# Patient Record
Sex: Female | Born: 1935 | Race: White | Hispanic: No | Smoking: Never smoker
Health system: Southern US, Community
[De-identification: ages and names within clinical notes are randomized; demographics above are authoritative.]

## PROBLEM LIST (undated history)

## (undated) DIAGNOSIS — I509 Heart failure, unspecified: Secondary | ICD-10-CM

## (undated) DIAGNOSIS — I10 Essential (primary) hypertension: Secondary | ICD-10-CM

## (undated) HISTORY — PX: PACEMAKER IMPLANT: EP1218

---

## 2020-08-12 ENCOUNTER — Other Ambulatory Visit: Payer: Self-pay

## 2020-08-12 ENCOUNTER — Emergency Department (HOSPITAL_COMMUNITY)
Admission: EM | Admit: 2020-08-12 | Discharge: 2020-08-13 | Disposition: A | Discharge status: Home / Self Care | Payer: Medicare Other | Attending: Emergency Medicine | Admitting: Emergency Medicine

## 2020-08-12 ENCOUNTER — Emergency Department (HOSPITAL_COMMUNITY): Payer: Medicare Other

## 2020-08-12 ENCOUNTER — Encounter (HOSPITAL_COMMUNITY): Payer: Self-pay | Admitting: Emergency Medicine

## 2020-08-12 DIAGNOSIS — R4189 Other symptoms and signs involving cognitive functions and awareness: Secondary | ICD-10-CM

## 2020-08-12 DIAGNOSIS — R464 Slowness and poor responsiveness: Secondary | ICD-10-CM | POA: Insufficient documentation

## 2020-08-12 DIAGNOSIS — Z950 Presence of cardiac pacemaker: Secondary | ICD-10-CM | POA: Insufficient documentation

## 2020-08-12 DIAGNOSIS — R110 Nausea: Secondary | ICD-10-CM | POA: Insufficient documentation

## 2020-08-12 DIAGNOSIS — R232 Flushing: Secondary | ICD-10-CM | POA: Insufficient documentation

## 2020-08-12 DIAGNOSIS — I509 Heart failure, unspecified: Secondary | ICD-10-CM | POA: Insufficient documentation

## 2020-08-12 DIAGNOSIS — R55 Syncope and collapse: Secondary | ICD-10-CM | POA: Insufficient documentation

## 2020-08-12 DIAGNOSIS — I110 Hypertensive heart disease with heart failure: Secondary | ICD-10-CM | POA: Insufficient documentation

## 2020-08-12 DIAGNOSIS — R0602 Shortness of breath: Secondary | ICD-10-CM | POA: Diagnosis present

## 2020-08-12 HISTORY — DX: Heart failure, unspecified: I50.9

## 2020-08-12 HISTORY — DX: Essential (primary) hypertension: I10

## 2020-08-12 LAB — BASIC METABOLIC PANEL
Anion gap: 12 (ref 5–15)
BUN: 16 mg/dL (ref 8–23)
CO2: 22 mmol/L (ref 22–32)
Calcium: 9.3 mg/dL (ref 8.9–10.3)
Chloride: 107 mmol/L (ref 98–111)
Creatinine, Ser: 0.67 mg/dL (ref 0.44–1.00)
GFR calc Af Amer: >60 mL/min (ref >60)
GFR calc non Af Amer: >60 mL/min (ref >60)
Glucose, Bld: 94 mg/dL (ref 70–99)
Potassium: 3.9 mmol/L (ref 3.5–5.1)
Sodium: 141 mmol/L (ref 135–145)

## 2020-08-12 LAB — CBC
HCT: 42.8 % (ref 36.0–46.0)
Hemoglobin: 14.1 g/dL (ref 12.0–15.0)
MCH: 30.4 pg (ref 26.0–34.0)
MCHC: 32.9 g/dL (ref 30.0–36.0)
MCV: 92.2 fL (ref 80.0–100.0)
Platelets: 182 10*3/uL (ref 150–400)
RBC: 4.64 MIL/uL (ref 3.87–5.11)
RDW: 12.9 % (ref 11.5–15.5)
WBC: 7.2 10*3/uL (ref 4.0–10.5)
nRBC: 0 % (ref 0.0–0.2)

## 2020-08-12 LAB — TROPONIN I (HIGH SENSITIVITY)
Troponin I (High Sensitivity): 8 ng/L (ref <18)
Troponin I (High Sensitivity): 11 ng/L (ref <18)

## 2020-08-12 NOTE — ED Provider Notes (Addendum)
[facility] EMERGENCY DEPARTMENT Provider Note   CSN: [id number] Arrival date & time: 08/12/20  1525     History Chief Complaint  Patient presents with  . flushing sensation  . Nausea  . Shortness of Breath    Rhonda Pearson is a 84 y.o. female.  HPI     84 year old female with history of CHF, hypertension who has a pacemaker in place comes in w/ chief complaint of nausea, flushing sensation and questionable loss of consciousness.  Patient is here with her daughter.  Patient states that she was in the backseat of her car, heading to abating when all of a sudden she started having hot sensation in her legs that moved up towards her chest.  She started feeling unwell and had nausea and shortness of breath.  No chest pain or sweating.  Symptoms lasted for about 10 minutes and then resolved.  Patient's daughter states that she was looking at her mother and she is noted that she appeared pale and uncomfortable.  Thereafter there was few seconds where patient was unconscious and not responding to her.  Patient's eyes did not roll back at any point and it did appear to her that she was moving her hands fine.  There was no incontinence and no confusion.   Patient reports that she has had syncopal episodes in the past because of PSVT's.  Past Medical History:  Diagnosis Date  . CHF (congestive heart failure) (HCC)   . Hypertension     There are no problems to display for this patient.   Past Surgical History:  Procedure Laterality Date  . PACEMAKER IMPLANT       OB History   No obstetric history on file.     No family history on file.  Social History   Tobacco Use  . Smoking status: Never Smoker  . Smokeless tobacco: Never Used  Substance Use Topics  . Alcohol use: Not Currently  . Drug use: Not Currently    Home Medications Prior to Admission medications   Not on File    Allergies    Patient has no allergy information on record.  Review of  Systems   Review of Systems  Constitutional: Positive for activity change. Negative for diaphoresis.  Respiratory: Positive for shortness of breath.   Gastrointestinal: Positive for nausea.  Allergic/Immunologic: Negative for immunocompromised state.  Hematological: Does not bruise/bleed easily.  All other systems reviewed and are negative.   Physical Exam Updated Vital Signs BP 134/78   Pulse 73   Temp 97.8 F (36.6 C) (Oral)   Resp 11   Ht 5' 1.5" (1.562 m)   Wt 67.1 kg   SpO2 100%   BMI 27.51 kg/m   Physical Exam Vitals and nursing note reviewed.  Constitutional:      Appearance: She is well-developed.  HENT:     Head: Normocephalic and atraumatic.  Eyes:     Extraocular Movements: Extraocular movements intact.     Pupils: Pupils are equal, round, and reactive to light.  Cardiovascular:     Rate and Rhythm: Normal rate.  Pulmonary:     Effort: Pulmonary effort is normal.     Breath sounds: No decreased breath sounds or wheezing.  Abdominal:     General: Bowel sounds are normal.  Musculoskeletal:     Cervical back: Normal range of motion and neck supple.     Right lower leg: No edema.     Left lower leg: No edema.  Skin:  General: Skin is warm and dry.  Neurological:     Mental Status: She is alert and oriented to person, place, and time.     ED Results / Procedures / Treatments   Labs (all labs ordered are listed, but only abnormal results are displayed) Labs Reviewed  BASIC METABOLIC PANEL  CBC  TROPONIN I (HIGH SENSITIVITY)  TROPONIN I (HIGH SENSITIVITY)    EKG EKG Interpretation  Date/Time:  Saturday August 12 2020 15:29:12 EDT Ventricular Rate:  75 PR Interval:  194 QRS Duration: 92 QT Interval:  434 QTC Calculation: 484 R Axis:   101 Text Interpretation: AV dual-paced rhythm Biventricular pacemaker detected Abnormal ECG No acute changes No old tracing to compare paced rhythm Confirmed by Derwood Kaplan [phone]) on 08/12/2020 5:38:13  PM   Radiology DG Chest 2 View  Result Date: 08/12/2020 CLINICAL DATA:  Chest pain. EXAM: CHEST - 2 VIEW COMPARISON:  None. FINDINGS: The heart size and mediastinal contours are within normal limits. Both lungs are clear. No pneumothorax or pleural effusion is noted. Left-sided pacemaker is in good position. The visualized skeletal structures are unremarkable. IMPRESSION: No active cardiopulmonary disease. Electronically Signed   By: Lupita Raider M.D.   On: 08/12/2020 16:03   CT Head Wo Contrast  Result Date: 08/12/2020 CLINICAL DATA:  Seizure-like activity EXAM: CT HEAD WITHOUT CONTRAST TECHNIQUE: Contiguous axial images were obtained from the base of the skull through the vertex without intravenous contrast. COMPARISON:  None. FINDINGS: Brain: No evidence of acute territorial infarction, hemorrhage, hydrocephalus,extra-axial collection or mass lesion/mass effect. Normal gray-white differentiation. Ventricles are normal in size and contour. Vascular: No hyperdense vessel or unexpected calcification. Skull: The skull is intact. No fracture or focal lesion identified. Sinuses/Orbits: The visualized paranasal sinuses and mastoid air cells are clear. The orbits and globes intact. Other: None IMPRESSION: No acute intracranial abnormality. Electronically Signed   By: Jonna Clark M.D.   On: 08/12/2020 22:08    Procedures Procedures (including critical care time)  Medications Ordered in ED Medications - No data to display  ED Course  I have reviewed the triage vital signs and the nursing notes.  Pertinent labs & imaging results that were available during my care of the patient were reviewed by me and considered in my medical decision making (see chart for details).    MDM Rules/Calculators/A&P                          DDx includes: Orthostatic hypotension Stroke Vertebral artery dissection/stenosis Dysrhythmia PE Vasovagal/neurocardiogenic syncope Aortic stenosis Valvular  disorder/Cardiomyopathy Anemia   84 year old female with history of CHF who has a pacemaker in place comes in a chief complaint of nausea, shortness of breath and unresponsiveness.  It appears that she had about a minute or 2 of being unresponsive.  She remembers rest of the event.  The symptoms started with her feeling hot and she felt unpleasant throughout.  It does not appear that there was a seizure-like activity that was witnessed by the daughter, but there was some blank staring.   Patient has a [employer]tronic pacer in place.  Pacemaker interrogation did not reveal any acute findings.  Patient has had 7 episodes of nonsustained V. tach's over the past several months and she has been made aware of it.  Her cardiologist is in [place], she will follow up with them.  My suspicion after ED work-up is that she might have had an absence seizure.  We  have advised her to have her PCP consult neurology as well.  Telemetry monitoring in the ED has been reassuring along with EKG and delta tropes.  Patient is stable for discharge if her CT scan of the brain looks normal.  The patient appears reasonably screened and/or stabilized for discharge and I doubt any other medical condition or other [facility] requiring further screening, evaluation, or treatment in the ED at this time prior to discharge.   Results from the ER workup discussed with the patient face to face and all questions answered to the best of my ability. The patient is safe for discharge with strict return precautions.   Final Clinical Impression(s) / ED Diagnoses Final diagnoses:  Unresponsive episode  Syncope and collapse    Rx / DC Orders ED Discharge Orders    None       Derwood Kaplan, MD 08/12/20 2123    Derwood Kaplan, MD 08/12/20 2239

## 2020-08-12 NOTE — ED Triage Notes (Addendum)
Pt to triage via [employer].  States pt was riding in back of car on the way to a wedding.  Sudden onset of flushing sensation that started in her feet and radiated up.  Reports mild nausea and states she had SOB during episode.  BP 202/120 and repeat 140/80 PTA.  Denies chest pain.  20g LAC.  Takes Eliquis.

## 2020-08-12 NOTE — ED Notes (Signed)
Pt daughters are here and would like to be notified when back in room or with any updates

## 2020-08-12 NOTE — Discharge Instructions (Signed)
We saw the ER for what appears to be an unresponsive spell.  The work-up in the ER including, interrogation of your pacemaker and CT scan of the brain is reassuring. All the blood work is normal.  We would like you to be seen by a cardiology for your pacemaker -it appears that the battery is running low. He also had few episodes of nonsustained V. tach that they should be made aware of.  We also think it might be worthwhile seeing your primary care doctor to see if you need a neurology consult for possible absence seizure. CT scan of the brain in the ER was normal.  Return to the ER if you have a repeat episode of unresponsiveness.

## 2020-08-12 NOTE — ED Notes (Signed)
[employer]tronic pacemaker interrogated °

## 2022-02-14 IMAGING — DX DG CHEST 2V
2 series · 2 of 2 positions shown · non-contrast
Comparison: None.

CLINICAL DATA: Chest pain.

EXAM:
CHEST - 2 VIEW

[chest pa]
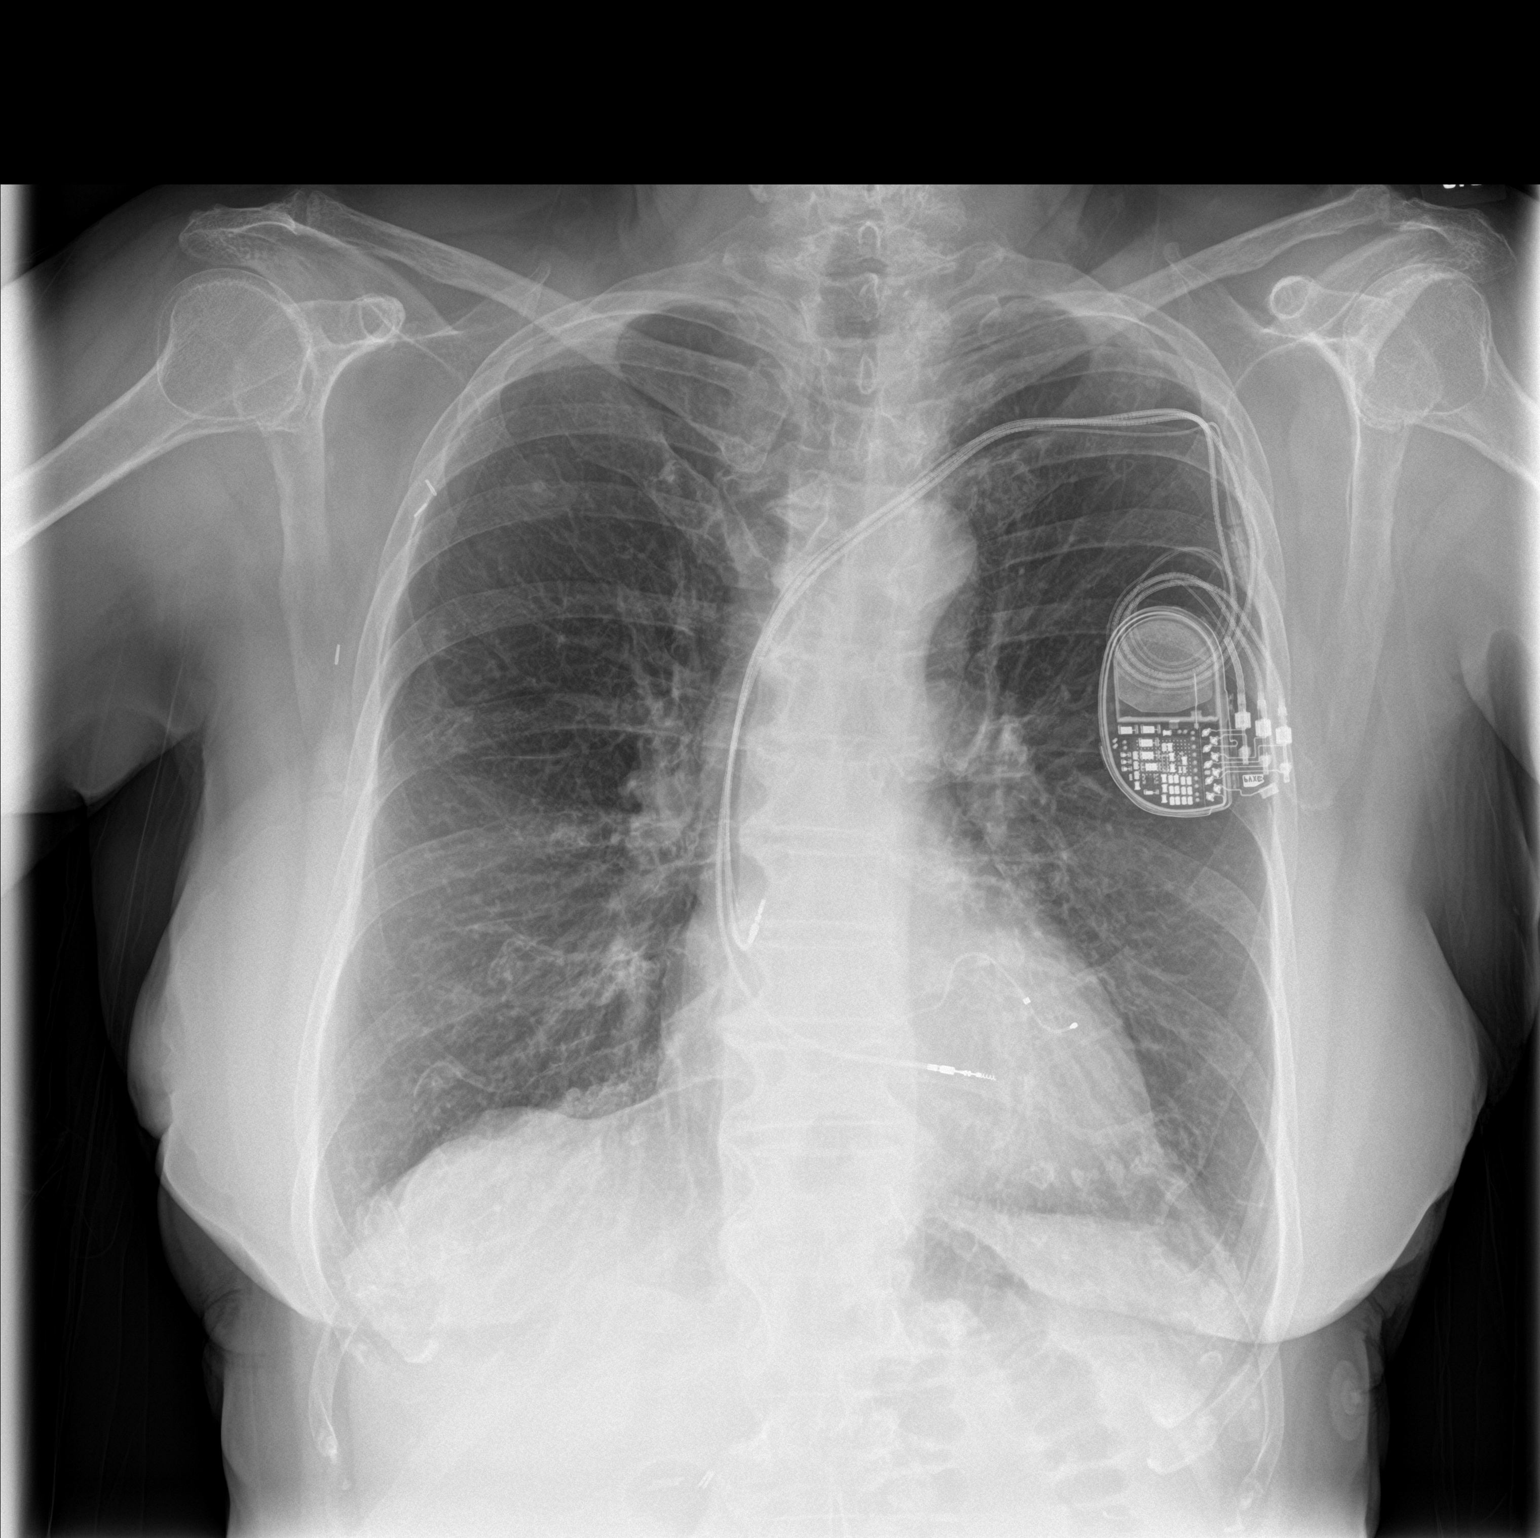

[chest lat]
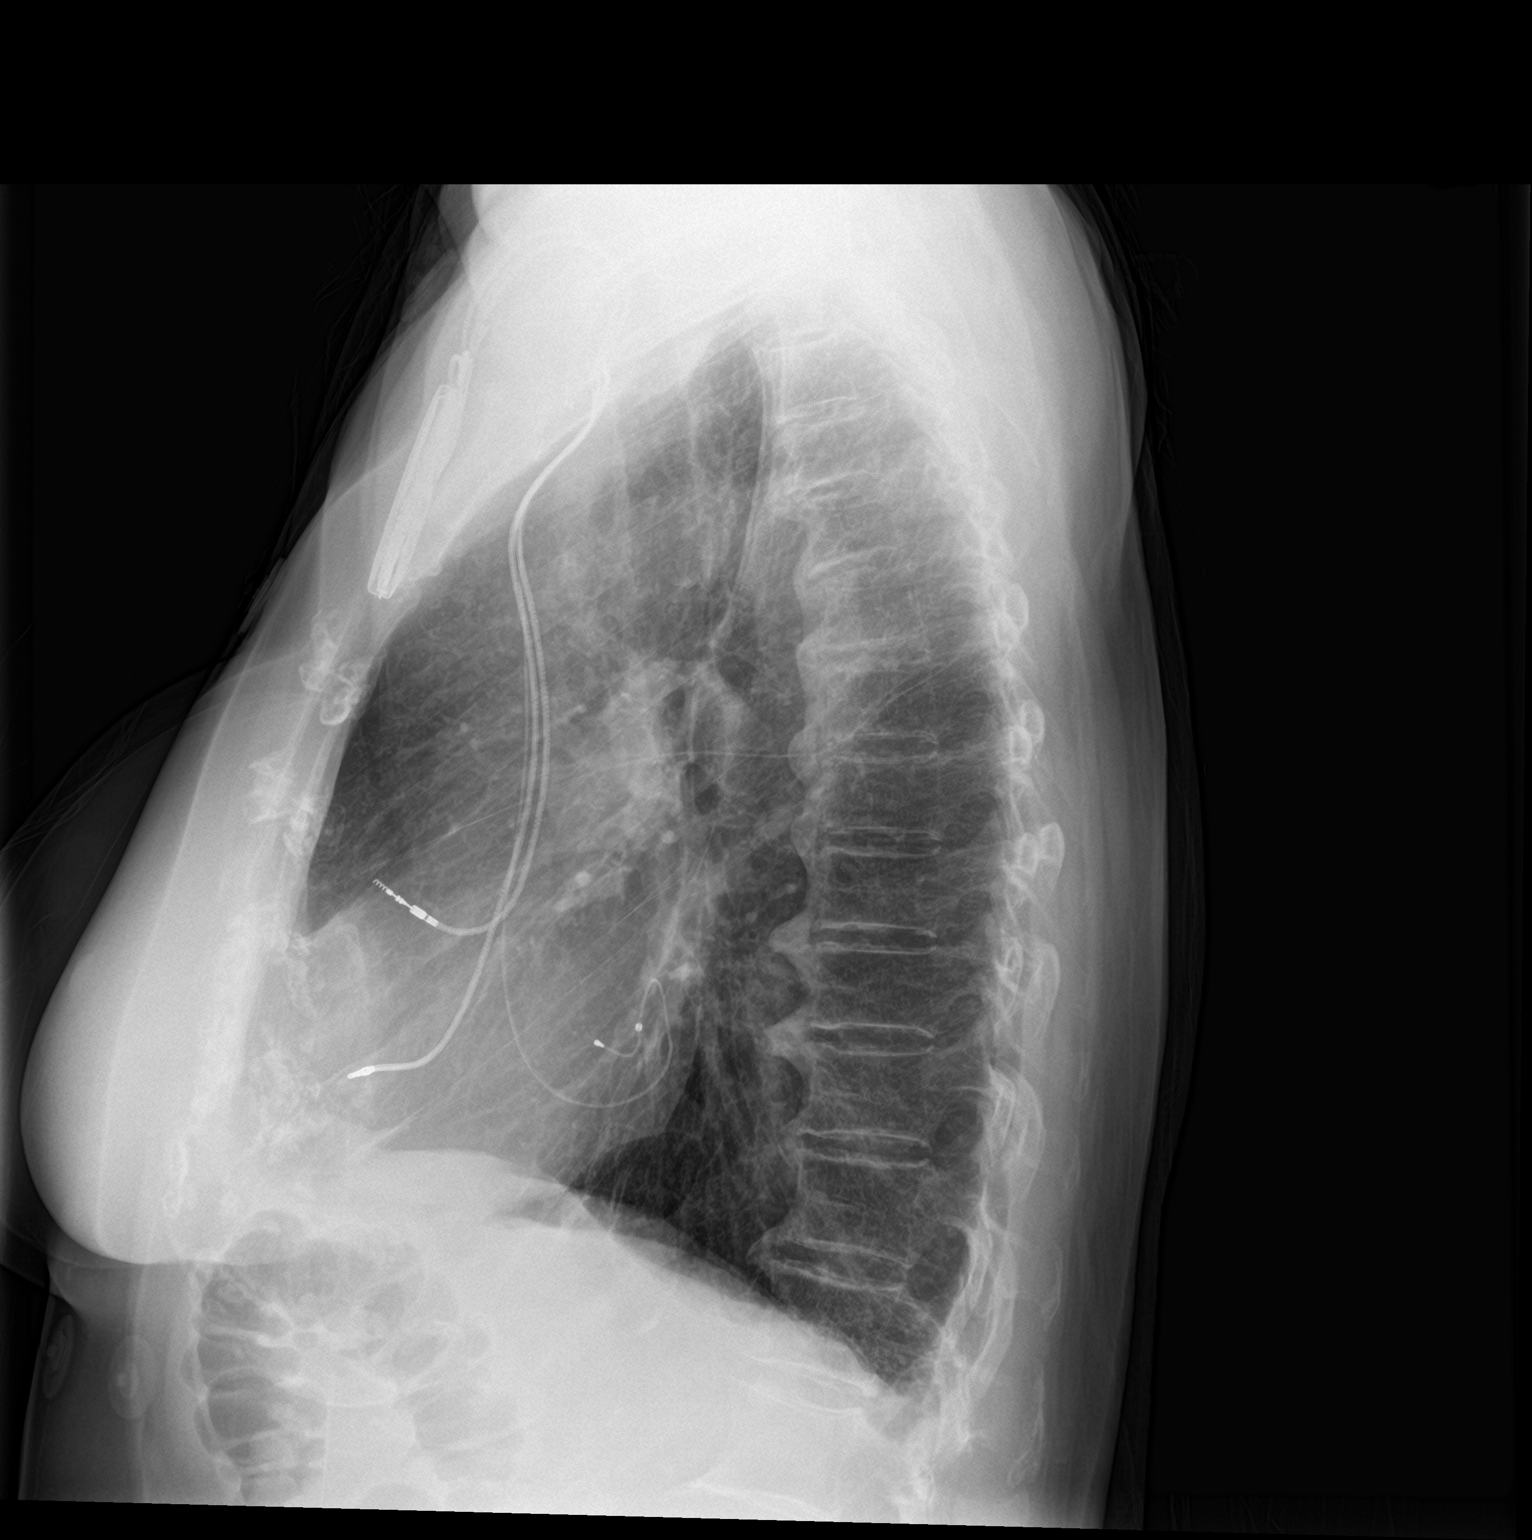

[2 of 2 positions shown; findings below may reference images not displayed]

FINDINGS: The heart size and mediastinal contours are within normal limits.
Both lungs are clear. No pneumothorax or pleural effusion is noted.
Left-sided pacemaker is in good position. The visualized skeletal
structures are unremarkable.
IMPRESSION: No active cardiopulmonary disease.
